# Patient Record
Sex: Female | Born: 1975 | Race: White | Hispanic: No | Marital: Married | Smoking: Never smoker
Health system: Southern US, Community
[De-identification: ages and names within clinical notes are randomized; demographics above are authoritative.]

## PROBLEM LIST (undated history)

## (undated) HISTORY — PX: CYST REMOVAL HAND: SHX6279

## (undated) HISTORY — PX: BREAST CYST EXCISION: SHX579

## (undated) HISTORY — PX: CYST REMOVAL NECK: SHX6281

---

## 2011-11-04 DIAGNOSIS — E785 Hyperlipidemia, unspecified: Secondary | ICD-10-CM | POA: Insufficient documentation

## 2011-11-04 DIAGNOSIS — E559 Vitamin D deficiency, unspecified: Secondary | ICD-10-CM | POA: Insufficient documentation

## 2012-05-03 DIAGNOSIS — Z6741 Type O blood, Rh negative: Secondary | ICD-10-CM | POA: Insufficient documentation

## 2020-08-05 ENCOUNTER — Encounter: Payer: Self-pay | Admitting: Medical-Surgical

## 2020-08-05 ENCOUNTER — Other Ambulatory Visit: Payer: Self-pay

## 2020-08-05 ENCOUNTER — Ambulatory Visit (INDEPENDENT_AMBULATORY_CARE_PROVIDER_SITE_OTHER): Payer: BC Managed Care – PPO | Admitting: Medical-Surgical

## 2020-08-05 VITALS — BP 117/75 | HR 66 | Temp 98.1°F | Ht 64.5 in | Wt 170.6 lb

## 2020-08-05 DIAGNOSIS — Z7689 Persons encountering health services in other specified circumstances: Secondary | ICD-10-CM | POA: Diagnosis not present

## 2020-08-05 DIAGNOSIS — Z1159 Encounter for screening for other viral diseases: Secondary | ICD-10-CM

## 2020-08-05 DIAGNOSIS — Z114 Encounter for screening for human immunodeficiency virus [HIV]: Secondary | ICD-10-CM

## 2020-08-05 DIAGNOSIS — Z0000 Encounter for general adult medical examination without abnormal findings: Secondary | ICD-10-CM | POA: Diagnosis not present

## 2020-08-05 DIAGNOSIS — M67431 Ganglion, right wrist: Secondary | ICD-10-CM

## 2020-08-05 NOTE — Progress Notes (Signed)
a  New Patient Office Visit  Subjective:  Patient ID: Mandy Suarez, female    DOB: Oct 07, 1976  Age: 44 y.o. MRN: [id number]  CC:  Chief Complaint  Patient presents with  . Establish Care    HPI Mandy Suarez presents to establish care.  Annual physical exam  Dentist- recently reestablished with a dentist, has a hole in one tooth that is needing repair. Eye exam- does not have an eye doctor and has never had a full eye exam, no vision changes or concerns Exercise- none currently Diet- no special diets  Ganglion cyst- dorsal right wrist ganglion cyst previously surgically removed but grew back soon after. She has to wear gloves at work and often bumps it causing it to be irritated. Not normally painful.  History reviewed. No pertinent past medical history.  Past Surgical History:  Procedure Laterality Date  . BREAST CYST EXCISION    . CYST REMOVAL HAND    . CYST REMOVAL NECK      Family History  Problem Relation Age of Onset  . Diabetes Father   . Breast cancer Maternal Grandmother   . Ovarian cancer Paternal Aunt     Social History   Socioeconomic History  . Marital status: Not on file    Spouse name: Not on file  . Number of children: Not on file  . Years of education: Not on file  . Highest education level: Not on file  Occupational History  . Not on file  Tobacco Use  . Smoking status: Never Smoker  . Smokeless tobacco: Never Used  Vaping Use  . Vaping Use: Never used  Substance and Sexual Activity  . Alcohol use: Not Currently  . Drug use: Never  . Sexual activity: Not Currently  Other Topics Concern  . Not on file  Social History Narrative  . Not on file   Social Determinants of Health   Financial Resource Strain:   . Difficulty of Paying Living Expenses: Not on file  Food Insecurity:   . Worried About Programme researcher, broadcasting/film/video in the Last Year: Not on file  . Ran Out of Food in the Last Year: Not on file  Transportation Needs:   . Lack of  Transportation (Medical): Not on file  . Lack of Transportation (Non-Medical): Not on file  Physical Activity:   . Days of Exercise per Week: Not on file  . Minutes of Exercise per Session: Not on file  Stress:   . Feeling of Stress : Not on file  Social Connections:   . Frequency of Communication with Friends and Family: Not on file  . Frequency of Social Gatherings with Friends and Family: Not on file  . Attends Religious Services: Not on file  . Active Member of Clubs or Organizations: Not on file  . Attends Banker Meetings: Not on file  . Marital Status: Not on file  Intimate Partner Violence:   . Fear of Current or Ex-Partner: Not on file  . Emotionally Abused: Not on file  . Physically Abused: Not on file  . Sexually Abused: Not on file    ROS Review of Systems  Constitutional: Negative for chills, fatigue, fever and unexpected weight change.  HENT: Negative for congestion, ear pain, sinus pressure and sore throat.   Eyes: Negative for visual disturbance.  Respiratory: Negative for cough, chest tightness, shortness of breath and wheezing.   Cardiovascular: Negative for chest pain, palpitations and leg swelling.  Gastrointestinal: Negative for abdominal  pain, constipation, diarrhea, nausea and vomiting.  Endocrine: Negative for cold intolerance, heat intolerance, polydipsia, polyphagia and polyuria.  Genitourinary: Negative for dysuria, frequency and urgency.  Musculoskeletal: Negative for arthralgias and myalgias.  Neurological: Negative for dizziness, weakness, light-headedness and headaches.  Hematological: Negative for adenopathy. Does not bruise/bleed easily.  Psychiatric/Behavioral: Negative for dysphoric mood, self-injury, sleep disturbance and suicidal ideas. The patient is not nervous/anxious.     Objective:   Today's Vitals: BP 117/75   Pulse 66   Temp 98.1 F (36.7 C) (Oral)   Ht 5' 4.5" (1.638 m)   Wt 170 lb 9.6 oz (77.4 kg)   LMP 08/03/2020    SpO2 96%   BMI 28.83 kg/m   Physical Exam Constitutional:      General: She is not in acute distress.    Appearance: Normal appearance. She is not ill-appearing.  HENT:     Head: Normocephalic and atraumatic.     Ears:     Comments: Poor visualization of bilateral TMs due to large amount of cerumen present in both ear canals.    Nose: Nose normal.     Mouth/Throat:     Mouth: Mucous membranes are moist.     Pharynx: No oropharyngeal exudate or posterior oropharyngeal erythema.  Eyes:     Extraocular Movements: Extraocular movements intact.     Conjunctiva/sclera: Conjunctivae normal.     Pupils: Pupils are equal, round, and reactive to light.  Neck:     Thyroid: No thyromegaly.     Vascular: No carotid bruit or JVD.     Trachea: Trachea normal.  Cardiovascular:     Rate and Rhythm: Normal rate and regular rhythm.     Pulses: Normal pulses.     Heart sounds: Normal heart sounds. No murmur heard.  No friction rub. No gallop.   Pulmonary:     Effort: Pulmonary effort is normal. No respiratory distress.     Breath sounds: Normal breath sounds. No wheezing.  Abdominal:     General: Bowel sounds are normal. There is no distension.     Palpations: Abdomen is soft.     Tenderness: There is no abdominal tenderness. There is no guarding.  Musculoskeletal:        General: Normal range of motion.     Cervical back: Normal range of motion and neck supple.  Skin:    General: Skin is warm and dry.  Neurological:     Mental Status: She is alert and oriented to person, place, and time.     Cranial Nerves: No cranial nerve deficit.  Psychiatric:        Mood and Affect: Mood normal.        Behavior: Behavior normal.        Thought Content: Thought content normal.        Judgment: Judgment normal.     Assessment & Plan:   1. Encounter to establish care Reviewed available information and discussed health concerns with patient. Requesting records from the [facility] in [facility] for last pap smear and mammogram.  2. Annual physical exam Checking CBC, CMP, and Lipid panel. - CBC - COMPLETE METABOLIC PANEL WITH GFR - Lipid panel  3. Encounter for screening for HIV Discussed screening recommendations. Patient agreeable so we will add this to blood work today. - HIV Antibody (routine testing w rflx)  4. Encounter for hepatitis C screening test for low risk patient Discussed screening recommendations. Patient agreeable so we will add this to blood  work today. - Hepatitis C antibody  5. Ganglion cyst of dorsum of right wrist Discussed ganglion cyst treatment and likelihood of recurrence. With the prominent placement and the regular irritation she is dealing with at work, advised that she should follow up with Dr. Karie Schwalbe for possible draining of the cyst.  No outpatient encounter medications on file as of 08/05/2020.   No facility-administered encounter medications on file as of 08/05/2020.   Follow-up: Return for annual physical exam in 1 year; ganglion cyst drainage with Dr. Karie Schwalbe at your convenience.   Thayer Ohm, DNP, APRN, FNP-BC [facility] and [facility] Medicine

## 2020-08-05 NOTE — Patient Instructions (Signed)

## 2020-08-06 LAB — HEPATITIS C ANTIBODY
Hepatitis C Ab: NONREACTIVE
SIGNAL TO CUT-OFF: 0.01 (ref <1.00)

## 2020-08-06 LAB — LIPID PANEL
Cholesterol: 254 mg/dL — ABNORMAL HIGH (ref <200)
HDL: 50 mg/dL (ref >=50)
LDL Cholesterol (Calc): 182 mg/dL (calc) — ABNORMAL HIGH
Non-HDL Cholesterol (Calc): 204 mg/dL (calc) — ABNORMAL HIGH (ref <130)
Total CHOL/HDL Ratio: 5.1 (calc) — ABNORMAL HIGH (ref <5.0)
Triglycerides: 101 mg/dL (ref <150)

## 2020-08-06 LAB — COMPLETE METABOLIC PANEL WITH GFR
AG Ratio: 2 (calc) (ref 1.0–2.5)
ALT: 24 U/L (ref 6–29)
AST: 22 U/L (ref 10–30)
Albumin: 4.3 g/dL (ref 3.6–5.1)
Alkaline phosphatase (APISO): 72 U/L (ref 31–125)
BUN: 11 mg/dL (ref 7–25)
CO2: 27 mmol/L (ref 20–32)
Calcium: 8.8 mg/dL (ref 8.6–10.2)
Chloride: 107 mmol/L (ref 98–110)
Creat: 0.59 mg/dL (ref 0.50–1.10)
GFR, Est African American: 131 mL/min/{1.73_m2} (ref >=60)
GFR, Est Non African American: 113 mL/min/{1.73_m2} (ref >=60)
Globulin: 2.2 g/dL (calc) (ref 1.9–3.7)
Glucose, Bld: 95 mg/dL (ref 65–99)
Potassium: 4 mmol/L (ref 3.5–5.3)
Sodium: 142 mmol/L (ref 135–146)
Total Bilirubin: 0.4 mg/dL (ref 0.2–1.2)
Total Protein: 6.5 g/dL (ref 6.1–8.1)

## 2020-08-06 LAB — HIV ANTIBODY (ROUTINE TESTING W REFLEX): HIV 1&2 Ab, 4th Generation: NONREACTIVE

## 2020-08-06 LAB — CBC
HCT: 39.8 % (ref 35.0–45.0)
Hemoglobin: 13.3 g/dL (ref 11.7–15.5)
MCH: 30.4 pg (ref 27.0–33.0)
MCHC: 33.4 g/dL (ref 32.0–36.0)
MCV: 91.1 fL (ref 80.0–100.0)
MPV: 9.4 fL (ref 7.5–12.5)
Platelets: 216 10*3/uL (ref 140–400)
RBC: 4.37 10*6/uL (ref 3.80–5.10)
RDW: 12.4 % (ref 11.0–15.0)
WBC: 6.1 10*3/uL (ref 3.8–10.8)

## 2020-08-20 ENCOUNTER — Ambulatory Visit (INDEPENDENT_AMBULATORY_CARE_PROVIDER_SITE_OTHER): Payer: BC Managed Care – PPO

## 2020-08-20 ENCOUNTER — Ambulatory Visit (INDEPENDENT_AMBULATORY_CARE_PROVIDER_SITE_OTHER): Payer: BC Managed Care – PPO | Admitting: Sports Medicine

## 2020-08-20 ENCOUNTER — Other Ambulatory Visit: Payer: Self-pay

## 2020-08-20 DIAGNOSIS — M67431 Ganglion, right wrist: Secondary | ICD-10-CM

## 2020-08-20 NOTE — Progress Notes (Signed)
    Procedures performed today:    Procedure: Real-time Ultrasound Guided aspiration/injection of right dorsal wrist ganglion Device: Samsung HS60  Verbal informed consent obtained.  Time-out conducted.  Noted no overlying erythema, induration, or other signs of local infection.  Skin prepped in a sterile fashion.  Local anesthesia: Topical Ethyl chloride.  With sterile technique and under real time ultrasound guidance:  Using 18-gauge needle I aspirated the dorsal ganglion, I then injected 1/2 cc kenalog 40.   Completed without difficulty  Pain immediately resolved suggesting accurate placement of the medication.  Advised to call if fevers/chills, erythema, induration, drainage, or persistent bleeding.  Images permanently stored and available for review in PACS.  Impression: Technically successful ultrasound guided injection.  ___________________________________________ Ihor Austin. Benjamin Stain, M.D., ABFM., CAQSM. Primary Care and Sports Medicine [facility]   Adjunct Instructor of Family Medicine  [employer]  Independent interpretation of notes and tests performed by another provider:   None.  Brief History, Exam, Impression, and Recommendations:    Ganglion cyst of wrist, right This is a very pleasant 44 year old female, she has a history of a ganglion cyst [place]urgical [place] out of [place]. She has never had an aspiration and injection. She is having recurrence of the cyst, it is large, unsightly and minimally tender. Aspiration and injection today, she is aware of the 50% recurrence rate. Return to see me in 1 month. 2 days of light duty at work.     ___________________________________________ Ihor Austin. Benjamin Stain, M.D., ABFM., CAQSM. Primary Care and Sports Medicine  [facility]  Adjunct Instructor of Family Medicine  [employer]

## 2020-08-20 NOTE — Assessment & Plan Note (Signed)
This is a very pleasant 44 year old female, she has a history of a ganglion cyst [place]. She has never had an aspiration and injection. She is having recurrence of the cyst, it is large, unsightly and minimally tender. Aspiration and injection today, she is aware of the 50% recurrence rate. Return to see me in 1 month. 2 days of light duty at work.

## 2020-09-17 ENCOUNTER — Ambulatory Visit (INDEPENDENT_AMBULATORY_CARE_PROVIDER_SITE_OTHER): Payer: BC Managed Care – PPO | Admitting: Sports Medicine

## 2020-09-17 DIAGNOSIS — M67431 Ganglion, right wrist: Secondary | ICD-10-CM | POA: Diagnosis not present

## 2020-09-17 NOTE — Assessment & Plan Note (Signed)
This is a pleasant 44 year old female, she has a history of a ganglion cyst [place] in [place]. She never had had an aspiration or injection. Today we aspirated the large ganglion cyst, she returns today, the cyst is approximately 1/2-1/3 of the size, nontender, at this point she feels as though she can live with it. She can return to see me on an as-needed basis, because is not painful we are going to leave it alone, but if she again desires any intervention I am happy to do a repeat aspiration and injection.

## 2020-09-17 NOTE — Progress Notes (Signed)
    Procedures performed today:    None.  Independent interpretation of notes and tests performed by another provider:   None.  Brief History, Exam, Impression, and Recommendations:    Ganglion cyst of wrist, right This is a pleasant 44 year old female, she has a history of a [place]. She never had had an aspiration or injection. Today we aspirated the large ganglion cyst, she returns today, the cyst is approximately 1/2-1/3 of the size, nontender, at this point she feels as though she can live with it. She can return to see me on an as-needed basis, because is not painful we are going to leave it alone, but if she again desires any intervention I am happy to do a repeat aspiration and injection.    ___________________________________________ Ihor Austin. Benjamin Stain, M.D., ABFM., CAQSM. Primary Care and Sports Medicine [facility]  Adjunct Instructor of Family Medicine  [employer]

## 2022-02-18 ENCOUNTER — Telehealth: Payer: BC Managed Care – PPO | Admitting: Nurse Practitioner

## 2022-02-18 DIAGNOSIS — Z3200 Encounter for pregnancy test, result unknown: Secondary | ICD-10-CM

## 2022-02-18 DIAGNOSIS — R102 Pelvic and perineal pain: Secondary | ICD-10-CM

## 2022-02-18 NOTE — Progress Notes (Signed)
Based on what you shared with me it looks like you have vaginal pain without discharge and may be pregnant,that should be evaluated in a face to face office visit. You need a urine pregnancy test as well as someone to evaluate yur vaginal pain for proper treatment. ? ?NOTE: There will be NO CHARGE for this eVisit ?  ?If you are having a true medical emergency please call 911.   ?  ? For an urgent face to face visit, [facility] has six urgent care centers for your convenience:  ?  ? [facility] ?Get Driving Directions ?[phone] ?[phone] ?[place], [place] [address] ?  ? [facility]) ?Get Driving Directions ?[phone] ?[address] ?[place], [place] [address] ? ?[facility]) ?Get Driving Directions ?[phone] ?[address] ?[place],  [place]  [address] ? ?[facility] ?Get Driving Directions ?[phone] ?[address] ?[place], [place] [address] ?  ?[facility] [place] ?Get Driving Directions  ?[phone] ?[address] ?[address] ?[place], [place] [address] ?  ?[facility] ?Get Driving Directions ?[phone] ?[address] ?[place], [place] [address] ? ?Your MyChart E-visit questionnaire answers were reviewed by a board certified advanced clinical practitioner to complete your personal care plan based on your specific symptoms.  Thank you for using e-Visits. ?  ? ?

## 2022-02-19 ENCOUNTER — Encounter: Payer: Self-pay | Admitting: Medical-Surgical

## 2022-02-19 ENCOUNTER — Telehealth: Payer: Self-pay | Admitting: *Deleted

## 2022-02-19 NOTE — Telephone Encounter (Signed)
Patient called wanting to be seen urgently.  ?No menstrual cycle since December. No abdominal pain, some minor cramping. No other symptoms. Scheduled for next available with Joy and if any acute symptoms develop she was instructed to call back.  ?

## 2022-02-19 NOTE — Telephone Encounter (Signed)
Returned call from 3:41 PM. Left patient a message to call and schedule New GYN appointment. ?

## 2022-02-24 ENCOUNTER — Ambulatory Visit: Payer: BC Managed Care – PPO | Admitting: Advanced Practice Midwife

## 2022-02-24 ENCOUNTER — Other Ambulatory Visit: Payer: Self-pay

## 2022-02-24 ENCOUNTER — Encounter: Payer: Self-pay | Admitting: Advanced Practice Midwife

## 2022-02-24 ENCOUNTER — Other Ambulatory Visit (HOSPITAL_COMMUNITY)
Admission: RE | Admit: 2022-02-24 | Discharge: 2022-02-24 | Disposition: A | Discharge status: Home / Self Care | Payer: BC Managed Care – PPO | Source: Ambulatory Visit | Attending: Advanced Practice Midwife | Admitting: Advanced Practice Midwife

## 2022-02-24 VITALS — BP 141/89 | HR 83 | Ht 64.5 in | Wt 169.0 lb

## 2022-02-24 DIAGNOSIS — Z01419 Encounter for gynecological examination (general) (routine) without abnormal findings: Secondary | ICD-10-CM | POA: Diagnosis not present

## 2022-02-24 DIAGNOSIS — N911 Secondary amenorrhea: Secondary | ICD-10-CM

## 2022-02-24 DIAGNOSIS — R102 Pelvic and perineal pain: Secondary | ICD-10-CM | POA: Diagnosis not present

## 2022-02-24 DIAGNOSIS — N6312 Unspecified lump in the right breast, upper inner quadrant: Secondary | ICD-10-CM

## 2022-02-24 DIAGNOSIS — Z1231 Encounter for screening mammogram for malignant neoplasm of breast: Secondary | ICD-10-CM | POA: Diagnosis not present

## 2022-02-24 NOTE — Progress Notes (Addendum)
? ?  GYNECOLOGY PROGRESS NOTE ? ?History:  ?45 y.o. No obstetric history on file. presents to [place] office today for problem gyn visit. She reports no menses x 3 months.  Prior to this periods were regular, monthly, and moderate in amount.  She reports some cramping like menstrual pain in the last 3 months but no bleeding.  She reports some occasional episodes of feeling hot but mostly these occur at work when she lifts heavy boxes, but have also occurred at home.  There are no other symptoms. Her mother had a tumor around the time she was expecting menopause and had a hysterectomy so she is concerned about that.  Her last Pap was normal, 3 years ago, but she is interested in repeating the Pap if we do an exam today.    She denies h/a, dizziness, shortness of breath, n/v, or fever/chills.   ? ?The following portions of the patient's history were reviewed and updated as appropriate: allergies, current medications, past family history, past medical history, past social history, past surgical history and problem list. Last pap smear on 07/27/2018 was normal, neg HRHPV. ? ?Health Maintenance Due  ?Topic Date Due  ? PAP SMEAR-Modifier  Never done  ? COVID-19 Vaccine (3 - Booster for Pfizer series) 06/19/2020  ? INFLUENZA VACCINE  Never done  ? TETANUS/TDAP  01/14/2022  ?  ? ?Review of Systems:  ?Pertinent items are noted in HPI. ?  ?Objective:  ?Physical Exam ?Blood pressure (!) 141/89, pulse 83, height 5' 4.5" (1.638 m), weight 169 lb (76.7 kg), last menstrual period 11/12/2021. ?VS reviewed, nursing note reviewed,  ?Constitutional: well developed, well nourished, no distress ?HEENT: normocephalic ?CV: normal rate ?Pulm/chest wall: normal effort ?Breast Exam:  right breast with one smooth round mobile mass, 3x3 cm in diameter at inner right part of breast, no skin or nipple changes or axillary nodes, left breast normal without mass, skin or nipple changes or axillary nodes ?Abdomen: soft ?Neuro: alert and  oriented x 3 ?Skin: warm, dry ?Psych: affect normal ?Pelvic exam: Cervix pink, visually closed, without lesion, scant white creamy discharge, vaginal walls and external genitalia normal ?Bimanual exam: Cervix 0/long/high, firm, anterior, neg CMT, uterus nontender, nonenlarged, adnexa without tenderness, enlargement, or mass ? ?Assessment & Plan:  ? ?1. Secondary physiologic amenorrhea ?--LMP 11/2021, regular monthly menses prior ?--Pelvic exam today wnl ?--Perimenopause vs other cause for oligomenorrhea/amenorrhea ? ?- Prolactin ?- TSH ?- B-HCG Quant ?- US PELVIC COMPLETE WITH TRANSVAGINAL; Future ? ?2. Acute pelvic pain, female ?--Cramping menstrual pain without bleeding x 2-3 months ?--LMP 11/2021 ? ?3. Well woman exam with routine gynecological exam ? ?- Cytology - PAP( [place])  ? ?Fatima Blank, CNM ?3:00 PM  ?

## 2022-02-25 NOTE — Addendum Note (Signed)
Addended by: Sharen Counter A on: 02/25/2022 08:29 PM ? ? Modules accepted: Orders ? ?

## 2022-02-26 LAB — CYTOLOGY - PAP
Adequacy: ABSENT
Comment: NEGATIVE
Diagnosis: NEGATIVE
High risk HPV: NEGATIVE

## 2022-02-27 ENCOUNTER — Other Ambulatory Visit: Payer: Self-pay

## 2022-02-27 ENCOUNTER — Ambulatory Visit (INDEPENDENT_AMBULATORY_CARE_PROVIDER_SITE_OTHER): Payer: BC Managed Care – PPO

## 2022-02-27 DIAGNOSIS — N911 Secondary amenorrhea: Secondary | ICD-10-CM | POA: Diagnosis not present

## 2022-02-27 DIAGNOSIS — N83201 Unspecified ovarian cyst, right side: Secondary | ICD-10-CM | POA: Diagnosis not present

## 2022-02-28 LAB — PROLACTIN: Prolactin: 6.3 ng/mL

## 2022-02-28 LAB — TSH: TSH: 2.08 mIU/L

## 2022-02-28 LAB — HCG, QUANTITATIVE, PREGNANCY: HCG, Total, QN: <3 m[IU]/mL

## 2022-03-02 ENCOUNTER — Telehealth: Payer: Self-pay | Admitting: Advanced Practice Midwife

## 2022-03-02 ENCOUNTER — Ambulatory Visit: Payer: BC Managed Care – PPO | Admitting: Medical-Surgical

## 2022-03-02 NOTE — Telephone Encounter (Signed)
Pt has normal pelvic ultrasound with follicular/simple cyst that will likely resolve.  She may have had an inovulatory cycle in January or February, causing her menses to be delayed. Based on [place] and lab findings, I would expect her periods to resume normally.  She should follow up in the office if her period does not return, or if she continues to have pelvic pain. ? ?I left a message for the patient to return my call to the office regarding her ultrasound results.   ?

## 2022-04-16 NOTE — Progress Notes (Signed)
HPI: ?Mandy Suarez is a 46 y.o. female who  has no past medical history on file. ? ?she presents to [facility] today, 04/17/22,  ?for chief complaint of: ?Annual physical exam ? ?Dentist: UTD, no concerns ?Eye exam: no correction, no concerns ?Exercise: walking a little ?Diet: no restrictions ?Pap smear: UTD, OBGYN ?Mammogram: UTD, OBGYN ?COVID vaccine: done with booster ?Concerns: ?None ? ?Past medical, surgical, social and family history reviewed: ? ?Patient Active Problem List  ? Diagnosis Date Noted  ? 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 Ganglion cyst of wrist, right 08/20/2020  ? Type O blood, Rh negative 05/03/2012  ? Vitamin D deficiency 11/04/2011  ? Other and unspecified hyperlipidemia 11/04/2011  ? ? ?Past Surgical History:  ?Procedure Laterality Date  ? BREAST CYST EXCISION    ? CYST REMOVAL HAND    ? CYST REMOVAL NECK    ? ? ?Social History  ? ?Tobacco Use  ? Smoking status: Never  ? Smokeless tobacco: Never  ?Substance Use Topics  ? Alcohol use: Not Currently  ? ? ?Family History  ?Problem Relation Age of Onset  ? Diabetes Father   ? Breast cancer Maternal Grandmother   ? Ovarian cancer Paternal Aunt   ?  ? ?Current medication list and allergy/intolerance information reviewed:   ? ?No current outpatient medications on file.  ? ?No current facility-administered medications for this visit.  ? ? ?No Known Allergies ?  ?Review of Systems: ?Constitutional:  No  fever, no chills, No recent illness, No unintentional weight changes. No significant fatigue.  ?HEENT: No  headache, no vision change, no hearing change, No sore throat, No  sinus pressure ?Cardiac: No  chest pain, No  pressure, No palpitations, No  Orthopnea ?Respiratory:  No  shortness of breath. No  Cough ?Gastrointestinal: No  abdominal pain, No  nausea, No  vomiting,  No  blood in stool, No  diarrhea, No  constipation  ?Musculoskeletal: No new myalgia/arthralgia ?Skin: No  Rash, No other  wounds/concerning lesions ?Genitourinary: No  incontinence, No  abnormal genital bleeding, No abnormal genital discharge ?Hem/Onc: No  easy bruising/bleeding, No  abnormal lymph node ?Endocrine: No cold intolerance,  No heat intolerance. No polyuria/polydipsia/polyphagia  ?Neurologic: No  weakness, No  dizziness, No  slurred speech/focal weakness/facial droop ?Psychiatric: No  concerns with depression, No  concerns with anxiety, No sleep problems, No mood problems ? ?Exam:  ?BP 127/83 (BP Location: Right Arm, Cuff Size: Normal)   Pulse 63   Resp 20   Ht 5' 4.5" (1.638 m)   Wt 171 lb 12.8 oz (77.9 kg)   SpO2 100%   BMI 29.03 kg/m?  ?Constitutional: VS see above. General Appearance: alert, well-developed, well-nourished, NAD ?Eyes: Normal lids and conjunctive, non-icteric sclera ?Ears, Nose, Mouth, Throat: MMM, Normal external inspection ears/nares/mouth/lips/gums. TM normal bilaterally. Pharynx/tonsils no erythema, no exudate. Nasal mucosa normal.  ?Neck: No masses, trachea midline. No thyroid enlargement. No tenderness/mass appreciated. No lymphadenopathy ?Respiratory: Normal respiratory effort. no wheeze, no rhonchi, no rales ?Cardiovascular: S1/S2 normal, no murmur, no rub/gallop auscultated. RRR. No lower extremity edema. Pedal pulse II/IV bilaterally DP and PT. No carotid bruit or JVD. No abdominal aortic bruit. ?Gastrointestinal: Nontender, no masses. No hepatomegaly, no splenomegaly. No hernia appreciated. Bowel sounds normal. Rectal exam deferred.  ?Musculoskeletal: Gait normal. No clubbing/cyanosis of digits.  ?Neurological: Normal balance/coordination. No tremor. No cranial nerve deficit on limited exam. Motor and sensation intact and symmetric. Cerebellar reflexes intact.  ?Skin: warm, dry,  intact. No rash/ulcer. No concerning nevi or subq nodules on limited exam.   ?Psychiatric: Normal judgment/insight. Normal mood and affect. Oriented x3.  ? ? ?ASSESSMENT/PLAN:  ? ?1. Annual physical exam ?Checking  labs today.  Up-to-date on preventative care.  Recommend updating eye exam at her earliest convenience.  Wellness information provided with AVS. ?- Lipid panel ?- COMPLETE METABOLIC PANEL WITH GFR ?- CBC with Differential/Platelet ? ?2. Need for tetanus booster ?Tdap given in office today.  VIS provided with AVS. ?- Tdap vaccine greater than or equal to 7yo IM ? ? ?Orders Placed This Encounter  ?Procedures  ? Tdap vaccine greater than or equal to 7yo IM  ? Lipid panel  ? COMPLETE METABOLIC PANEL WITH GFR  ? CBC with Differential/Platelet  ? ? ?No orders of the defined types were placed in this encounter. ? ? ?There are no Patient Instructions on file for this visit. ? ?Follow-up plan: Return in about 1 year (around 04/18/2023) for annual physical exam (or sooner if needed). ? ?Thayer Ohm, DNP, APRN, FNP-BC ?[facility] ?Primary Care and Sports Medicine ?

## 2022-04-17 ENCOUNTER — Encounter: Payer: Self-pay | Admitting: Medical-Surgical

## 2022-04-17 ENCOUNTER — Ambulatory Visit (INDEPENDENT_AMBULATORY_CARE_PROVIDER_SITE_OTHER): Payer: BC Managed Care – PPO | Admitting: Medical-Surgical

## 2022-04-17 VITALS — BP 127/83 | HR 63 | Resp 20 | Ht 64.5 in | Wt 171.8 lb

## 2022-04-17 DIAGNOSIS — Z23 Encounter for immunization: Secondary | ICD-10-CM | POA: Diagnosis not present

## 2022-04-17 DIAGNOSIS — Z0000 Encounter for general adult medical examination without abnormal findings: Secondary | ICD-10-CM | POA: Diagnosis not present

## 2022-04-18 LAB — CBC WITH DIFFERENTIAL/PLATELET
Absolute Monocytes: 530 cells/uL (ref 200–950)
Basophils Absolute: 40 cells/uL (ref 0–200)
Basophils Relative: 0.7 %
Eosinophils Absolute: 182 cells/uL (ref 15–500)
Eosinophils Relative: 3.2 %
HCT: 36.7 % (ref 35.0–45.0)
Hemoglobin: 12.1 g/dL (ref 11.7–15.5)
Lymphs Abs: 1927 cells/uL (ref 850–3900)
MCH: 30.3 pg (ref 27.0–33.0)
MCHC: 33 g/dL (ref 32.0–36.0)
MCV: 92 fL (ref 80.0–100.0)
MPV: 9.8 fL (ref 7.5–12.5)
Monocytes Relative: 9.3 %
Neutro Abs: 3021 cells/uL (ref 1500–7800)
Neutrophils Relative %: 53 %
Platelets: 220 10*3/uL (ref 140–400)
RBC: 3.99 10*6/uL (ref 3.80–5.10)
RDW: 12.1 % (ref 11.0–15.0)
Total Lymphocyte: 33.8 %
WBC: 5.7 10*3/uL (ref 3.8–10.8)

## 2022-04-18 LAB — COMPLETE METABOLIC PANEL WITH GFR
AG Ratio: 1.9 (calc) (ref 1.0–2.5)
ALT: 15 U/L (ref 6–29)
AST: 16 U/L (ref 10–30)
Albumin: 4.2 g/dL (ref 3.6–5.1)
Alkaline phosphatase (APISO): 73 U/L (ref 31–125)
BUN: 10 mg/dL (ref 7–25)
CO2: 29 mmol/L (ref 20–32)
Calcium: 8.7 mg/dL (ref 8.6–10.2)
Chloride: 106 mmol/L (ref 98–110)
Creat: 0.62 mg/dL (ref 0.50–0.99)
Globulin: 2.2 g/dL (calc) (ref 1.9–3.7)
Glucose, Bld: 81 mg/dL (ref 65–99)
Potassium: 3.7 mmol/L (ref 3.5–5.3)
Sodium: 142 mmol/L (ref 135–146)
Total Bilirubin: 0.4 mg/dL (ref 0.2–1.2)
Total Protein: 6.4 g/dL (ref 6.1–8.1)
eGFR: 113 mL/min/{1.73_m2} (ref >=60)

## 2022-04-18 LAB — LIPID PANEL
Cholesterol: 167 mg/dL (ref <200)
HDL: 42 mg/dL — ABNORMAL LOW (ref >=50)
LDL Cholesterol (Calc): 110 mg/dL (calc) — ABNORMAL HIGH
Non-HDL Cholesterol (Calc): 125 mg/dL (calc) (ref <130)
Total CHOL/HDL Ratio: 4 (calc) (ref <5.0)
Triglycerides: 61 mg/dL (ref <150)

## 2022-04-22 ENCOUNTER — Encounter: Payer: Self-pay | Admitting: Medical-Surgical

## 2022-04-22 ENCOUNTER — Ambulatory Visit (INDEPENDENT_AMBULATORY_CARE_PROVIDER_SITE_OTHER): Payer: BC Managed Care – PPO

## 2022-04-22 ENCOUNTER — Ambulatory Visit (INDEPENDENT_AMBULATORY_CARE_PROVIDER_SITE_OTHER): Payer: BC Managed Care – PPO | Admitting: Medical-Surgical

## 2022-04-22 DIAGNOSIS — M545 Low back pain, unspecified: Secondary | ICD-10-CM | POA: Diagnosis not present

## 2022-04-22 MED ORDER — METHYLPREDNISOLONE 4 MG PO TBPK: ORAL_TABLET | ORAL | 0 refills | Status: DC

## 2022-04-22 NOTE — Assessment & Plan Note (Signed)
Getting lumbar spine x-rays today.  No red flags so suspect this is simply soft tissue trauma from impact.  Discussed conservative treatment using ice, heat, and Tylenol 1000 mg every 8 hours as needed.  We will go ahead and treat with a methylprednisolone taper pack.  Handout provided with AVS for stretches to start at home to help with pain reduction and stiffness.  Once TaperPak has been completed, okay to use ibuprofen in conjunction with Tylenol. ?

## 2022-04-22 NOTE — Patient Instructions (Addendum)
Use Tylenol 1000 mg every 8 hours as needed ?Completed steroid taper as instructed ?Use heat or ice for comfort ?Once pain is better, start stretches below ? ?Low Back Sprain or Strain Rehab ?Ask your health care provider which exercises are safe for you. Do exercises exactly as told by your health care provider and adjust them as directed. It is normal to feel mild stretching, pulling, tightness, or discomfort as you do these exercises. Stop right away if you feel sudden pain or your pain gets worse. Do not begin these exercises until told by your health care provider. ?Stretching and range-of-motion exercises ?These exercises warm up your muscles and joints and improve the movement and flexibility of your back. These exercises also help to relieve pain, numbness, and tingling. ?Lumbar rotation ? ?Lie on your back on a firm bed or the floor with your knees bent. ?Straighten your arms out to your sides so each arm forms a 90-degree angle (right angle) with a side of your body. ?Slowly move (rotate) both of your knees to one side of your body until you feel a stretch in your lower back (lumbar). Try not to let your shoulders lift off the floor. ?Hold this position for __________ seconds. ?Tense your abdominal muscles and slowly move your knees back to the starting position. ?Repeat this exercise on the other side of your body. ?Repeat __________ times. Complete this exercise __________ times a day. ?Single knee to chest ? ?Lie on your back on a firm bed or the floor with both legs straight. ?Bend one of your knees. Use your hands to move your knee up toward your chest until you feel a gentle stretch in your lower back and buttock. ?Hold your leg in this position by holding on to the front of your knee. ?Keep your other leg as straight as possible. ?Hold this position for __________ seconds. ?Slowly return to the starting position. ?Repeat with your other leg. ?Repeat __________ times. Complete this exercise  __________ times a day. ?Prone extension on elbows ? ?Lie on your abdomen on a firm bed or the floor (prone position). ?Prop yourself up on your elbows. ?Use your arms to help lift your chest up until you feel a gentle stretch in your abdomen and your lower back. ?This will place some of your body weight on your elbows. If this is uncomfortable, try stacking pillows under your chest. ?Your hips should stay down, against the surface that you are lying on. Keep your hip and back muscles relaxed. ?Hold this position for __________ seconds. ?Slowly relax your upper body and return to the starting position. ?Repeat __________ times. Complete this exercise __________ times a day. ?Strengthening exercises ?These exercises build strength and endurance in your back. Endurance is the ability to use your muscles for a long time, even after they get tired. ?Pelvic tilt ?This exercise strengthens the muscles that lie deep in the abdomen. ?Lie on your back on a firm bed or the floor with your legs extended. ?Bend your knees so they are pointing toward the ceiling and your feet are flat on the floor. ?Tighten your lower abdominal muscles to press your lower back against the floor. This motion will tilt your pelvis so your tailbone points up toward the ceiling instead of pointing to your feet or the floor. ?To help with this exercise, you may place a small towel under your lower back and try to push your back into the towel. ?Hold this position for __________ seconds. ?Let your  muscles relax completely before you repeat this exercise. ?Repeat __________ times. Complete this exercise __________ times a day. ?Alternating arm and leg raises ? ?Get on your hands and knees on a firm surface. If you are on a hard floor, you may want to use padding, such as an exercise mat, to cushion your knees. ?Line up your arms and legs. Your hands should be directly below your shoulders, and your knees should be directly below your hips. ?Lift your  left leg behind you. At the same time, raise your right arm and straighten it in front of you. ?Do not lift your leg higher than your hip. ?Do not lift your arm higher than your shoulder. ?Keep your abdominal and back muscles tight. ?Keep your hips facing the ground. ?Do not arch your back. ?Keep your balance carefully, and do not hold your breath. ?Hold this position for __________ seconds. ?Slowly return to the starting position. ?Repeat with your right leg and your left arm. ?Repeat __________ times. Complete this exercise __________ times a day. ?Abdominal set with straight leg raise ? ?Lie on your back on a firm bed or the floor. ?Bend one of your knees and keep your other leg straight. ?Tense your abdominal muscles and lift your straight leg up, 4-6 inches (10-15 cm) off the ground. ?Keep your abdominal muscles tight and hold this position for __________ seconds. ?Do not hold your breath. ?Do not arch your back. Keep it flat against the ground. ?Keep your abdominal muscles tense as you slowly lower your leg back to the starting position. ?Repeat with your other leg. ?Repeat __________ times. Complete this exercise __________ times a day. ?Single leg lower with bent knees ?Lie on your back on a firm bed or the floor. ?Tense your abdominal muscles and lift your feet off the floor, one foot at a time, so your knees and hips are bent in 90-degree angles (right angles). ?Your knees should be over your hips and your lower legs should be parallel to the floor. ?Keeping your abdominal muscles tense and your knee bent, slowly lower one of your legs so your toe touches the ground. ?Lift your leg back up to return to the starting position. ?Do not hold your breath. ?Do not let your back arch. Keep your back flat against the ground. ?Repeat with your other leg. ?Repeat __________ times. Complete this exercise __________ times a day. ?Posture and body mechanics ?Good posture and healthy body mechanics can help to relieve  stress in your body's tissues and joints. Body mechanics refers to the movements and positions of your body while you do your daily activities. Posture is part of body mechanics. Good posture means: ?Your spine is in its natural S-curve position (neutral). ?Your shoulders are pulled back slightly. ?Your head is not tipped forward (neutral). ?Follow these guidelines to improve your posture and body mechanics in your everyday activities. ?Standing ? ?When standing, keep your spine neutral and your feet about hip-width apart. Keep a slight bend in your knees. Your ears, shoulders, and hips should line up. ?When you do a task in which you stand in one place for a long time, place one foot up on a stable object that is 2-4 inches (5-10 cm) high, such as a footstool. This helps keep your spine neutral. ?Sitting ? ?When sitting, keep your spine neutral and keep your feet flat on the floor. Use a footrest, if necessary, and keep your thighs parallel to the floor. Avoid rounding your shoulders, and avoid tilting  your head forward. ?When working at a desk or a computer, keep your desk at a height where your hands are slightly lower than your elbows. Slide your chair under your desk so you are close enough to maintain good posture. ?When working at a computer, place your monitor at a height where you are looking straight ahead and you do not have to tilt your head forward or downward to look at the screen. ?Resting ?When lying down and resting, avoid positions that are most painful for you. ?If you have pain with activities such as sitting, bending, stooping, or squatting, lie in a position in which your body does not bend very much. For example, avoid curling up on your side with your arms and knees near your chest (fetal position). ?If you have pain with activities such as standing for a long time or reaching with your arms, lie with your spine in a neutral position and bend your knees slightly. Try the following  positions: ?Lying on your side with a pillow between your knees. ?Lying on your back with a pillow under your knees. ?Lifting ? ?When lifting objects, keep your feet at least shoulder-width apart and tighten your abdominal
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
## 2022-04-24 ENCOUNTER — Telehealth: Payer: Self-pay | Admitting: Medical-Surgical
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
## 2022-04-28 NOTE — Telephone Encounter (Signed)
Contacted patient to let her know that we have completed the forms and faxed them to [phone]  Copy sent to scan

## 2022-06-04 ENCOUNTER — Ambulatory Visit: Payer: BC Managed Care – PPO | Admitting: Medical-Surgical

## 2022-08-06 ENCOUNTER — Ambulatory Visit (INDEPENDENT_AMBULATORY_CARE_PROVIDER_SITE_OTHER): Payer: BC Managed Care – PPO | Admitting: Family Medicine

## 2022-08-06 ENCOUNTER — Encounter: Payer: Self-pay | Admitting: Family Medicine

## 2022-08-06 VITALS — BP 145/80 | HR 57 | Ht 64.5 in | Wt 171.0 lb

## 2022-08-06 DIAGNOSIS — N644 Mastodynia: Secondary | ICD-10-CM

## 2022-08-06 DIAGNOSIS — N61 Mastitis without abscess: Secondary | ICD-10-CM | POA: Diagnosis not present

## 2022-08-06 DIAGNOSIS — Z23 Encounter for immunization: Secondary | ICD-10-CM | POA: Diagnosis not present

## 2022-08-06 MED ORDER — DOXYCYCLINE HYCLATE 100 MG PO TABS: 100.0000 mg | ORAL_TABLET | Freq: Two times a day (BID) | ORAL | 0 refills | Status: DC

## 2022-08-06 NOTE — Progress Notes (Signed)
Acute Office Visit  Subjective:     Patient ID: Mandy Suarez, female    DOB: Mar 12, 1976, 46 y.o.   MRN: [id number]  Chief Complaint  Patient presents with   Breast Mass    HPI Patient is in today for breast lump, right. Says had abnormal mammo in 2018.  It showed a possible mass/cyst in the right breast.  They had recommended short-term follow-up.  It was in the 10:30 position about 7 cm from the nipple.   Now feels something coming through the skin.  No drainage.   ROS      Objective:    BP (!) 145/80   Pulse (!) 57   Ht 5' 4.5" (1.638 m)   Wt 171 lb (77.6 kg)   SpO2 97%   BMI 28.90 kg/m    Physical Exam Vitals reviewed.  Constitutional:      Appearance: She is well-developed.  HENT:     Head: Normocephalic and atraumatic.  Eyes:     Conjunctiva/sclera: Conjunctivae normal.  Cardiovascular:     Rate and Rhythm: Normal rate.  Pulmonary:     Effort: Pulmonary effort is normal.  Skin:    General: Skin is dry.     Coloration: Skin is not pale.  Neurological:     Mental Status: She is alert and oriented to person, place, and time.  Psychiatric:        Behavior: Behavior normal.     3 and half by 5 cm erythematous very tender raised area near the right mid chest.  She has some erythema extending towards the mid breast about 3 to centimeters past the border of the raised area.  No results found for any visits on 08/06/22.    EXAM:  DIGITAL DIAGNOSTIC RIGHT MAMMOGRAM WITH CAD   ULTRASOUND RIGHT BREAST   COMPARISON:  Screening study, 07/27/2018.  No older exams.   ACR Breast Density Category c: The breast tissue is heterogeneously  dense, which may obscure small masses.   FINDINGS:  On spot compression imaging, the possible mass in the retroareolar  region mostly disperses suggesting that it was due to superimposed  tissue. The possible mass in the upper outer right breast more  completely disperses. There are no areas of architectural   distortion. No suspicious calcifications.   Mammographic images were processed with CAD.   On physical exam, no mass is palpated in the retroareolar or upper  outer right breast.   Targeted ultrasound is performed, showing a simple cyst in the right  breast at 10:30 o'clock, 7 cm the nipple, middle depth, measuring 6  x 5 x 5 mm. This likely accounts for the original screening  abnormality noted in the upper breast on the MLO view. There are no  retroareolar masses.   IMPRESSION:  1. The possible mass noted in the retroareolar right breast on the  screening study mostly disperses on spot compression imaging. There  is also no retroareolar sonographic mass. Given that the  mammographic finding does not fully resolve, short-term follow-up is  recommended.  2. Small benign cyst in the upper outer right breast at 10:30  o'clock.   RECOMMENDATION:  Diagnostic right breast mammography and possible ultrasound in 6  months.     Assessment & Plan:   Problem List Items Addressed This Visit   None Visit Diagnoses     Cellulitis of right breast    -  Primary   Relevant Orders   MM Digital Diagnostic Bilat  US BREAST COMPLETE UNI RIGHT INC AXILLA   Breast pain, right       Relevant Orders   MM Digital Diagnostic Bilat   US BREAST COMPLETE UNI RIGHT INC AXILLA      I strongly suspect an infected sebaceous cyst based on exam but it is difficult to say she definitely has some cellulitis presents organoid and placed on doxycycline.  Recommend warm compresses and will refer back for diagnostic mammogram and ultrasound for further work-up since it is on the breast and she has a prior history of a slight abnormality on her right breast noted back in 2019.  Meds ordered this encounter  Medications   doxycycline (VIBRA-TABS) 100 MG tablet    Sig: Take 1 tablet (100 mg total) by mouth 2 (two) times daily.    Dispense:  14 tablet    Refill:  0    No follow-ups on file.  Nani Gasser, MD

## 2022-08-06 NOTE — Telephone Encounter (Signed)
Hoping a couple of days of antibiotics will make a big difference so no additional restrictions at this time but if she still having a lot of pain past Sunday then let us know on Tuesday when we are back open.

## 2022-08-06 NOTE — Telephone Encounter (Signed)
Forwarding to Dr. Linford Arnold as she saw this patient today.

## 2022-08-11 ENCOUNTER — Encounter: Payer: Self-pay | Admitting: Family Medicine

## 2022-08-11 NOTE — Telephone Encounter (Signed)
FYI since you saw her for this issue

## 2022-08-12 ENCOUNTER — Encounter: Payer: Self-pay | Admitting: Family Medicine

## 2022-08-12 NOTE — Telephone Encounter (Signed)
Completed and given to [place].  Just need to clarify the return date.

## 2022-08-12 NOTE — Telephone Encounter (Signed)
I think the orders were faxed to [facility] in [place].  So I guess we can just refax them to [facility] in [place].  The [place] location may even be able to just transfer it to the breast center in [place].  Not sure.

## 2022-08-13 MED ORDER — DOXYCYCLINE HYCLATE 100 MG PO TABS: 100.0000 mg | ORAL_TABLET | Freq: Two times a day (BID) | ORAL | 0 refills | Status: DC

## 2022-08-18 ENCOUNTER — Encounter: Payer: Self-pay | Admitting: Family Medicine

## 2022-08-18 DIAGNOSIS — N61 Mastitis without abscess: Secondary | ICD-10-CM | POA: Diagnosis not present

## 2022-08-18 DIAGNOSIS — N644 Mastodynia: Secondary | ICD-10-CM | POA: Diagnosis not present

## 2022-08-18 DIAGNOSIS — N6310 Unspecified lump in the right breast, unspecified quadrant: Secondary | ICD-10-CM | POA: Diagnosis not present

## 2022-08-18 LAB — HM MAMMOGRAPHY

## 2022-08-19 ENCOUNTER — Telehealth: Payer: Self-pay | Admitting: *Deleted

## 2022-08-19 NOTE — Telephone Encounter (Signed)
Mandy Suarez will print and send to patient.

## 2022-08-19 NOTE — Telephone Encounter (Signed)
Form completed,faxed,confirmation received and scanned into patient's chart. 

## 2022-08-22 ENCOUNTER — Encounter: Payer: Self-pay | Admitting: Medical-Surgical

## 2022-08-24 ENCOUNTER — Telehealth: Payer: Self-pay | Admitting: *Deleted

## 2022-08-24 NOTE — Telephone Encounter (Signed)
Pt called stating that her return to work date is today.   I informed her that the date that Dr. Madilyn Fireman had written on her forms is correct and that she was told that if she was still experiencing any issues that she was to have f/u with her pcp. Pt thought that since she was given more ABX that it would count towards her absence. I told her that the ABX had nothing to do with her returning to work.   Pt stated that her job had her returning to work today. I informed her that Dr. Madilyn Fireman would not make any changes to her fmla.  She voiced understanding.

## 2022-09-15 ENCOUNTER — Ambulatory Visit (INDEPENDENT_AMBULATORY_CARE_PROVIDER_SITE_OTHER): Payer: BC Managed Care – PPO | Admitting: Medical-Surgical

## 2022-09-15 ENCOUNTER — Encounter: Payer: Self-pay | Admitting: Medical-Surgical

## 2022-09-15 VITALS — BP 112/68 | HR 75 | Resp 20 | Ht 64.5 in | Wt 174.9 lb

## 2022-09-15 DIAGNOSIS — M67431 Ganglion, right wrist: Secondary | ICD-10-CM

## 2022-09-15 DIAGNOSIS — N61 Mastitis without abscess: Secondary | ICD-10-CM

## 2022-09-15 NOTE — Progress Notes (Signed)
Established Patient Office Visit  Subjective   Patient ID: Mandy Suarez, female   DOB: [birth year] Age: 47 y.o. MRN: [id number]   Chief Complaint  Patient presents with   Cyst   HPI Pleasant 46 year old female presenting today for reevaluation of a cyst at that is draining.  She was seen on 8/31 by Dr. Linford Arnold and diagnosed with cellulitis of the breast.  She was provided with a 1 week course of doxycycline twice daily which she completed as prescribed.  Approximately 1 week later, she noted that the area in her breast had seem to come towards the surface and opened up with purulent drainage.  She underwent a bilateral diagnostic mammogram and ultrasound which showed a small residual cyst just under the skin that was draining pus.  Today, she reports that the cyst has improved greatly although it is still there.  It is now draining small amounts of clear fluid but has no other signs of current infection.  Notes that it seems to have gotten smaller and is no longer painful/tender.  No fevers, chills, or generalized myalgias.  Reports that the ganglion cyst of the dorsal right wrist has returned.  She has had this surgically removed in the past.  She has also recently had it drained and injected with steroids to help reduce it.  Unfortunately, it keeps coming back and is quite an annoyance with her job.  She does work with [employer] and has to use her hands throughout the day.  The ganglion cyst limits her mobility and poses a risk for accidental trauma to the area.  She is interested in having this removed again.   Objective:    Vitals:   09/15/22 1357  BP: 112/68  Pulse: 75  Resp: 20  Height: 5' 4.5" (1.638 m)  Weight: 174 lb 14.4 oz (79.3 kg)  SpO2: 98%  BMI (Calculated): 29.57    Physical Exam Vitals and nursing note reviewed.  Constitutional:      General: She is not in acute distress.    Appearance: Normal appearance. She is not ill-appearing.  HENT:     Head: Normocephalic and  atraumatic.  Cardiovascular:     Rate and Rhythm: Normal rate and regular rhythm.     Pulses: Normal pulses.     Heart sounds: Normal heart sounds.  Pulmonary:     Effort: Pulmonary effort is normal. No respiratory distress.     Breath sounds: Normal breath sounds. No wheezing, rhonchi or rales.  Chest:    Musculoskeletal:     Comments: Large dorsal right wrist ganglion cyst.  Skin:    General: Skin is warm and dry.  Neurological:     Mental Status: She is alert and oriented to person, place, and time.  Psychiatric:        Mood and Affect: Mood normal.        Behavior: Behavior normal.        Thought Content: Thought content normal.        Judgment: Judgment normal.      No results found for this or any previous visit (from the past 24 hour(s)).     The 10-year ASCVD risk score (Arnett DK, et al., 2019) is: 0.7%   Values used to calculate the score:     Age: 65 years     Sex: Female     Is Non-Hispanic African American: No     Diabetic: No     Tobacco smoker: No  Systolic Blood Pressure: 073 mmHg     Is BP treated: No     HDL Cholesterol: 42 mg/dL     Total Cholesterol: 167 mg/dL   Assessment & Plan:   1. Cellulitis of right breast Symptoms do appear to be improving slowly.  She has clear drainage and no indications for current infection.  Suspect that this will resolve over the next 2 to 3 weeks.  If it does not, we may need to get her in with general surgery for further evaluation and intervention.  Patient verbalizes understanding and will monitor for signs/symptoms of infection.  She will let us know if we need to place a referral for her in a couple of weeks.  2. Ganglion cyst of wrist, right Unfortunately, she has had this ganglion cyst removed before and it is very likely to return even with surgical removal.  Since she would like to have it addressed, referring to hand surgery. - Ambulatory referral to Hand Surgery  Return if symptoms worsen or fail to  improve.  ___________________________________________ Clearnce Sorrel, DNP, APRN, FNP-BC Primary Care and [place]

## 2022-10-01 DIAGNOSIS — M67431 Ganglion, right wrist: Secondary | ICD-10-CM | POA: Diagnosis not present

## 2022-10-01 DIAGNOSIS — M25531 Pain in right wrist: Secondary | ICD-10-CM | POA: Diagnosis not present

## 2022-10-09 DIAGNOSIS — M67431 Ganglion, right wrist: Secondary | ICD-10-CM | POA: Diagnosis not present

## 2022-10-22 DIAGNOSIS — M67431 Ganglion, right wrist: Secondary | ICD-10-CM | POA: Diagnosis not present

## 2023-04-19 ENCOUNTER — Ambulatory Visit (INDEPENDENT_AMBULATORY_CARE_PROVIDER_SITE_OTHER): Payer: BC Managed Care – PPO | Admitting: Medical-Surgical

## 2023-04-19 ENCOUNTER — Encounter: Payer: Self-pay | Admitting: Medical-Surgical

## 2023-04-19 VITALS — BP 125/77 | HR 67 | Resp 20 | Ht 64.5 in | Wt 180.6 lb

## 2023-04-19 DIAGNOSIS — Z13220 Encounter for screening for lipoid disorders: Secondary | ICD-10-CM

## 2023-04-19 DIAGNOSIS — Z0000 Encounter for general adult medical examination without abnormal findings: Secondary | ICD-10-CM | POA: Diagnosis not present

## 2023-04-19 DIAGNOSIS — Z1211 Encounter for screening for malignant neoplasm of colon: Secondary | ICD-10-CM

## 2023-04-19 NOTE — Progress Notes (Signed)
Complete physical exam  Patient: Mandy Suarez   DOB: 1976-06-21   47 y.o. Female  MRN: [id number]  Subjective:    Chief Complaint  Patient presents with   Annual Exam   ABIHA IM is a 47 y.o. female who presents today for a complete physical exam. She reports consuming a general diet.  Walking at the park.  She generally feels well. She reports sleeping fairly well. She does not have additional problems to discuss today.    Most recent fall risk assessment:    04/19/2023   10:56 AM  Fall Risk   Falls in the past year? 0  Number falls in past yr: 0  Injury with Fall? 0  Risk for fall due to : No Fall Risks  Follow up Falls evaluation completed     Most recent depression screenings:    04/19/2023   10:57 AM 09/15/2022    1:57 PM  PHQ 2/9 Scores  PHQ - 2 Score 0 0    Vision:Within last year, Dental: No current dental problems and Receives regular dental care, and STD: The patient denies history of sexually transmitted disease.    Patient Care Team: Christen Butter, NP as PCP - General (Nurse Practitioner)   No outpatient medications prior to visit.   No facility-administered medications prior to visit.   Review of Systems  Constitutional:  Negative for chills, fever, malaise/fatigue and weight loss.  HENT:  Negative for congestion, ear pain, hearing loss, sinus pain and sore throat.   Eyes:  Negative for blurred vision, photophobia and pain.  Respiratory:  Negative for cough, shortness of breath and wheezing.   Cardiovascular:  Negative for chest pain, palpitations and leg swelling.  Gastrointestinal:  Negative for abdominal pain, constipation, diarrhea, heartburn, nausea and vomiting.  Genitourinary:  Negative for dysuria, frequency and urgency.  Musculoskeletal:  Negative for falls and neck pain.  Skin:  Negative for itching and rash.  Neurological:  Negative for dizziness, weakness and headaches.  Endo/Heme/Allergies:  Negative for polydipsia. Does not  bruise/bleed easily.  Psychiatric/Behavioral:  Negative for depression, substance abuse and suicidal ideas. The patient is not nervous/anxious.      Objective:    BP 125/77 (BP Location: Left Arm, Cuff Size: Normal)   Pulse 67   Resp 20   Ht 5' 4.5" (1.638 m)   Wt 180 lb 9.6 oz (81.9 kg)   SpO2 99%   BMI 30.52 kg/m    Physical Exam Vitals reviewed.  Constitutional:      General: She is not in acute distress.    Appearance: Normal appearance. She is obese. She is not ill-appearing.  HENT:     Head: Normocephalic and atraumatic.     Right Ear: Tympanic membrane, ear canal and external ear normal. There is no impacted cerumen.     Left Ear: Tympanic membrane, ear canal and external ear normal. There is no impacted cerumen.     Nose: Nose normal. No congestion or rhinorrhea.     Mouth/Throat:     Mouth: Mucous membranes are moist.     Pharynx: No oropharyngeal exudate or posterior oropharyngeal erythema.  Eyes:     General: No scleral icterus.       Right eye: No discharge.        Left eye: No discharge.     Extraocular Movements: Extraocular movements intact.     Conjunctiva/sclera: Conjunctivae normal.     Pupils: Pupils are equal, round, and reactive to light.  Neck:     Thyroid: No thyromegaly.     Vascular: No carotid bruit or JVD.     Trachea: Trachea normal.  Cardiovascular:     Rate and Rhythm: Normal rate and regular rhythm.     Pulses: Normal pulses.     Heart sounds: Normal heart sounds. No murmur heard.    No friction rub. No gallop.  Pulmonary:     Effort: Pulmonary effort is normal. No respiratory distress.     Breath sounds: Normal breath sounds. No wheezing.  Abdominal:     General: Bowel sounds are normal. There is no distension.     Palpations: Abdomen is soft.     Tenderness: There is no abdominal tenderness. There is no guarding.  Musculoskeletal:        General: Normal range of motion.     Cervical back: Normal range of motion and neck supple.   Lymphadenopathy:     Cervical: No cervical adenopathy.  Skin:    General: Skin is warm and dry.  Neurological:     Mental Status: She is alert and oriented to person, place, and time.     Cranial Nerves: No cranial nerve deficit.  Psychiatric:        Mood and Affect: Mood normal.        Behavior: Behavior normal.        Thought Content: Thought content normal.        Judgment: Judgment normal.      No results found for any visits on 04/19/23.     Assessment & Plan:    Routine Health Maintenance and Physical Exam  Immunization History  Administered Date(s) Administered   Influenza,inj,Quad PF,6+ Mos 08/06/2022   PFIZER(Purple Top)SARS-COV-2 Vaccination 04/02/2020, 04/24/2020   Rho (D) Immune Globulin 05/03/2012   Tdap 01/15/2012, 04/17/2022    Health Maintenance  Topic Date Due   COLONOSCOPY (Pts 45-58yrs Insurance coverage will need to be confirmed)  Never done   COVID-19 Vaccine (3 - 2023-24 season) 05/05/2023 (Originally 08/07/2022)   INFLUENZA VACCINE  07/08/2023   PAP SMEAR-Modifier  02/25/2027   DTaP/Tdap/Td (3 - Td or Tdap) 04/17/2032   Hepatitis C Screening  Completed   HIV Screening  Completed   HPV VACCINES  Aged Out    Discussed health benefits of physical activity, and encouraged her to engage in regular exercise appropriate for her age and condition.  1. Colon cancer screening Referring to GI.  - Ambulatory referral to Gastroenterology  2. Annual physical exam Checking labs as below. UTD on preventative care. Wellness information provided with AVS.  - CBC with Differential/Platelet - COMPLETE METABOLIC PANEL WITH GFR - Lipid panel  3. Lipid screening Checking lipids today.  - Lipid panel  Return in about 1 year (around 04/18/2024) for annual physical exam.   Christen Butter, NP

## 2023-04-20 LAB — COMPLETE METABOLIC PANEL WITH GFR
AG Ratio: 2 (calc) (ref 1.0–2.5)
ALT: 22 U/L (ref 6–29)
AST: 20 U/L (ref 10–35)
Albumin: 4.3 g/dL (ref 3.6–5.1)
Alkaline phosphatase (APISO): 68 U/L (ref 31–125)
BUN: 9 mg/dL (ref 7–25)
CO2: 27 mmol/L (ref 20–32)
Calcium: 8.8 mg/dL (ref 8.6–10.2)
Chloride: 104 mmol/L (ref 98–110)
Creat: 0.67 mg/dL (ref 0.50–0.99)
Globulin: 2.2 g/dL (calc) (ref 1.9–3.7)
Glucose, Bld: 97 mg/dL (ref 65–99)
Potassium: 4 mmol/L (ref 3.5–5.3)
Sodium: 139 mmol/L (ref 135–146)
Total Bilirubin: 0.4 mg/dL (ref 0.2–1.2)
Total Protein: 6.5 g/dL (ref 6.1–8.1)
eGFR: 110 mL/min/{1.73_m2} (ref >=60)

## 2023-04-20 LAB — LIPID PANEL
Cholesterol: 216 mg/dL — ABNORMAL HIGH (ref <200)
HDL: 50 mg/dL (ref >=50)
LDL Cholesterol (Calc): 144 mg/dL (calc) — ABNORMAL HIGH
Non-HDL Cholesterol (Calc): 166 mg/dL (calc) — ABNORMAL HIGH (ref <130)
Total CHOL/HDL Ratio: 4.3 (calc) (ref <5.0)
Triglycerides: 102 mg/dL (ref <150)

## 2023-04-20 LAB — CBC WITH DIFFERENTIAL/PLATELET
Absolute Monocytes: 576 cells/uL (ref 200–950)
Basophils Absolute: 58 cells/uL (ref 0–200)
Basophils Relative: 0.8 %
Eosinophils Absolute: 230 cells/uL (ref 15–500)
Eosinophils Relative: 3.2 %
HCT: 39.4 % (ref 35.0–45.0)
Hemoglobin: 13.1 g/dL (ref 11.7–15.5)
Lymphs Abs: 2297 cells/uL (ref 850–3900)
MCH: 29.8 pg (ref 27.0–33.0)
MCHC: 33.2 g/dL (ref 32.0–36.0)
MCV: 89.7 fL (ref 80.0–100.0)
MPV: 9.7 fL (ref 7.5–12.5)
Monocytes Relative: 8 %
Neutro Abs: 4039 cells/uL (ref 1500–7800)
Neutrophils Relative %: 56.1 %
Platelets: 209 10*3/uL (ref 140–400)
RBC: 4.39 10*6/uL (ref 3.80–5.10)
RDW: 12.3 % (ref 11.0–15.0)
Total Lymphocyte: 31.9 %
WBC: 7.2 10*3/uL (ref 3.8–10.8)
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
## 2024-04-20 ENCOUNTER — Encounter: Payer: Self-pay | Admitting: Medical-Surgical

## 2024-04-20 ENCOUNTER — Ambulatory Visit (INDEPENDENT_AMBULATORY_CARE_PROVIDER_SITE_OTHER): Payer: BC Managed Care – PPO | Admitting: Medical-Surgical

## 2024-04-20 VITALS — BP 125/78 | HR 69 | Resp 20 | Ht 64.5 in | Wt 176.0 lb

## 2024-04-20 DIAGNOSIS — E559 Vitamin D deficiency, unspecified: Secondary | ICD-10-CM | POA: Diagnosis not present

## 2024-04-20 DIAGNOSIS — Z0000 Encounter for general adult medical examination without abnormal findings: Secondary | ICD-10-CM

## 2024-04-20 DIAGNOSIS — E785 Hyperlipidemia, unspecified: Secondary | ICD-10-CM

## 2024-04-20 DIAGNOSIS — Z1211 Encounter for screening for malignant neoplasm of colon: Secondary | ICD-10-CM | POA: Diagnosis not present

## 2024-04-20 DIAGNOSIS — Z1231 Encounter for screening mammogram for malignant neoplasm of breast: Secondary | ICD-10-CM

## 2024-04-20 DIAGNOSIS — R222 Localized swelling, mass and lump, trunk: Secondary | ICD-10-CM

## 2024-04-20 NOTE — Patient Instructions (Signed)
 Preventive Care 16-46 Years Old, Female  Preventive care refers to lifestyle choices and visits with your health care provider that can promote health and wellness. Preventive care visits are also called wellness exams.  What can I expect for my preventive care visit?  Counseling  Your health care provider may ask you questions about your:  Medical history, including:  Past medical problems.  Family medical history.  Pregnancy history.  Current health, including:  Menstrual cycle.  Method of birth control.  Emotional well-being.  Home life and relationship well-being.  Sexual activity and sexual health.  Lifestyle, including:  Alcohol, nicotine or tobacco, and drug use.  Access to firearms.  Diet, exercise, and sleep habits.  Work and work Astronomer.  Sunscreen use.  Safety issues such as seatbelt and bike helmet use.  Physical exam  Your health care provider will check your:  Height and weight. These may be used to calculate your BMI (body mass index). BMI is a measurement that tells if you are at a healthy weight.  Waist circumference. This measures the distance around your waistline. This measurement also tells if you are at a healthy weight and may help predict your risk of certain diseases, such as type 2 diabetes and high blood pressure.  Heart rate and blood pressure.  Body temperature.  Skin for abnormal spots.  What immunizations do I need?    Vaccines are usually given at various ages, according to a schedule. Your health care provider will recommend vaccines for you based on your age, medical history, and lifestyle or other factors, such as travel or where you work.  What tests do I need?  Screening  Your health care provider may recommend screening tests for certain conditions. This may include:  Lipid and cholesterol levels.  Diabetes screening. This is done by checking your blood sugar (glucose) after you have not eaten for a while (fasting).  Pelvic exam and Pap test.  Hepatitis B test.  Hepatitis C  test.  HIV (human immunodeficiency virus) test.  STI (sexually transmitted infection) testing, if you are at risk.  Lung cancer screening.  Colorectal cancer screening.  Mammogram. Talk with your health care provider about when you should start having regular mammograms. This may depend on whether you have a family history of breast cancer.  BRCA-related cancer screening. This may be done if you have a family history of breast, ovarian, tubal, or peritoneal cancers.  Bone density scan. This is done to screen for osteoporosis.  Talk with your health care provider about your test results, treatment options, and if necessary, the need for more tests.  Follow these instructions at home:  Eating and drinking    Eat a diet that includes fresh fruits and vegetables, whole grains, lean protein, and low-fat dairy products.  Take vitamin and mineral supplements as recommended by your health care provider.  Do not drink alcohol if:  Your health care provider tells you not to drink.  You are pregnant, may be pregnant, or are planning to become pregnant.  If you drink alcohol:  Limit how much you have to 0-1 drink a day.  Know how much alcohol is in your drink. In the [place] one drink equals one 12 oz bottle of beer (355 mL), one 5 oz glass of wine (148 mL), or one 1 oz glass of hard liquor (44 mL).  Lifestyle  Brush your teeth every morning and night with fluoride toothpaste. Floss one time each day.  Exercise for at least  30 minutes 5 or more days each week.  Do not use any products that contain nicotine or tobacco. These products include cigarettes, chewing tobacco, and vaping devices, such as e-cigarettes. If you need help quitting, ask your health care provider.  Do not use drugs.  If you are sexually active, practice safe sex. Use a condom or other form of protection to prevent STIs.  If you do not wish to become pregnant, use a form of birth control. If you plan to become pregnant, see your health care provider for a  prepregnancy visit.  Take aspirin only as told by your health care provider. Make sure that you understand how much to take and what form to take. Work with your health care provider to find out whether it is safe and beneficial for you to take aspirin daily.  Find healthy ways to manage stress, such as:  Meditation, yoga, or listening to music.  Journaling.  Talking to a trusted person.  Spending time with friends and family.  Minimize exposure to UV radiation to reduce your risk of skin cancer.  Safety  Always wear your seat belt while driving or riding in a vehicle.  Do not drive:  If you have been drinking alcohol. Do not ride with someone who has been drinking.  When you are tired or distracted.  While texting.  If you have been using any mind-altering substances or drugs.  Wear a helmet and other protective equipment during sports activities.  If you have firearms in your house, make sure you follow all gun safety procedures.  Seek help if you have been physically or sexually abused.  What's next?  Visit your health care provider once a year for an annual wellness visit.  Ask your health care provider how often you should have your eyes and teeth checked.  Stay up to date on all vaccines.  This information is not intended to replace advice given to you by your health care provider. Make sure you discuss any questions you have with your health care provider.  Document Revised: 05/21/2021 Document Reviewed: 05/21/2021  Elsevier Patient Education  2024 [employer].

## 2024-04-20 NOTE — Progress Notes (Signed)
 Complete physical exam  Patient: Mandy Suarez   DOB: May 11, 1976   48 y.o. Female  MRN: [id number]  Subjective:     Chief Complaint  Patient presents with   Annual Exam    Mandy Suarez is a 48 y.o. female who presents today for a complete physical exam. She reports consuming a general diet. Walking and playing with her kids for exercise.  She generally feels fairly well. She reports sleeping well. She does not have additional problems to discuss today.    Most recent fall risk assessment:    04/19/2023   10:56 AM  Fall Risk   Falls in the past year? 0  Number falls in past yr: 0  Injury with Fall? 0  Risk for fall due to : No Fall Risks  Follow up Falls evaluation completed     Most recent depression screenings:    04/20/2024    2:08 PM 04/19/2023   10:57 AM  PHQ 2/9 Scores  PHQ - 2 Score 0 0    Vision:Within last year, Dental: No current dental problems and Receives regular dental care, and STD: The patient denies history of sexually transmitted disease.    Patient Care Team: Cherre Cornish, NP as PCP - General (Nurse Practitioner)   No outpatient medications prior to visit.   No facility-administered medications prior to visit.   Review of Systems  Constitutional:  Negative for chills, fever, malaise/fatigue and weight loss.  HENT:  Negative for congestion, ear pain, hearing loss, sinus pain and sore throat.   Eyes:  Negative for blurred vision, photophobia and pain.  Respiratory:  Negative for cough, shortness of breath and wheezing.   Cardiovascular:  Negative for chest pain, palpitations and leg swelling.  Gastrointestinal:  Negative for abdominal pain, constipation, diarrhea, heartburn, nausea and vomiting.  Genitourinary:  Negative for dysuria, frequency and urgency.  Musculoskeletal:  Negative for falls and neck pain.  Skin:  Negative for itching and rash.  Neurological:  Negative for dizziness, weakness and headaches.  Endo/Heme/Allergies:  Negative  for polydipsia. Does not bruise/bleed easily.  Psychiatric/Behavioral:  Negative for depression, substance abuse and suicidal ideas. The patient is not nervous/anxious.      Objective:     BP 125/78 (BP Location: Left Arm, Cuff Size: Normal)   Pulse 69   Resp 20   Ht 5' 4.5" (1.638 m)   Wt 176 lb 0.6 oz (79.9 kg)   SpO2 97%   BMI 29.75 kg/m    Physical Exam Vitals reviewed.  Constitutional:      General: She is not in acute distress.    Appearance: Normal appearance. She is not ill-appearing.  HENT:     Head: Normocephalic and atraumatic.     Right Ear: Tympanic membrane, ear canal and external ear normal. There is no impacted cerumen.     Left Ear: Tympanic membrane, ear canal and external ear normal. There is no impacted cerumen.     Nose: Nose normal. No congestion or rhinorrhea.     Mouth/Throat:     Mouth: Mucous membranes are moist.     Pharynx: No oropharyngeal exudate or posterior oropharyngeal erythema.  Eyes:     General: No scleral icterus.       Right eye: No discharge.        Left eye: No discharge.     Extraocular Movements: Extraocular movements intact.     Conjunctiva/sclera: Conjunctivae normal.     Pupils: Pupils are equal, round, and reactive  to light.  Neck:     Thyroid : No thyromegaly.     Vascular: No carotid bruit or JVD.     Trachea: Trachea normal.  Cardiovascular:     Rate and Rhythm: Normal rate and regular rhythm.     Pulses: Normal pulses.     Heart sounds: Normal heart sounds. No murmur heard.    No friction rub. No gallop.  Pulmonary:     Effort: Pulmonary effort is normal. No respiratory distress.     Breath sounds: Normal breath sounds. No wheezing.  Chest:    Abdominal:     General: Bowel sounds are normal. There is no distension.     Palpations: Abdomen is soft.     Tenderness: There is no abdominal tenderness. There is no guarding.  Musculoskeletal:        General: Normal range of motion.     Cervical back: Normal range of  motion and neck supple.  Lymphadenopathy:     Cervical: No cervical adenopathy.  Skin:    General: Skin is warm and dry.  Neurological:     Mental Status: She is alert and oriented to person, place, and time.     Cranial Nerves: No cranial nerve deficit.  Psychiatric:        Mood and Affect: Mood normal.        Behavior: Behavior normal.        Thought Content: Thought content normal.        Judgment: Judgment normal.   No results found for any visits on 04/20/24.     Assessment & Plan:    Routine Health Maintenance and Physical Exam  Immunization History  Administered Date(s) Administered   Influenza,inj,Quad PF,6+ Mos 08/06/2022   PFIZER(Purple Top)SARS-COV-2 Vaccination 04/02/2020, 04/24/2020   Rho (D) Immune Globulin 05/03/2012   Tdap 01/15/2012, 04/17/2022    Health Maintenance  Topic Date Due   Colonoscopy  Never done   COVID-19 Vaccine (3 - 2024-25 season) 05/06/2024 (Originally 08/08/2023)   INFLUENZA VACCINE  07/07/2024   Cervical Cancer Screening (HPV/Pap Cotest)  02/25/2027   DTaP/Tdap/Td (3 - Td or Tdap) 04/17/2032   Hepatitis C Screening  Completed   HIV Screening  Completed   HPV VACCINES  Aged Out   Meningococcal B Vaccine  Aged Out    Discussed health benefits of physical activity, and encouraged her to engage in regular exercise appropriate for her age and condition.  1. Annual physical exam (Primary) Checking labs as below. UTD on preventative care. Wellness information provided with AVS. - CBC with Differential/Platelet - CMP14+EGFR  2. Vitamin D deficiency Checking vitamin D. - VITAMIN D 25 Hydroxy (Vit-D Deficiency, Fractures)  3. Colon cancer screening Referring to GI for colonoscopy. - Ambulatory referral to Gastroenterology  4. Dyslipidemia Checking lipid panel. - Lipid panel  5. Encounter for screening mammogram for malignant neoplasm of breast Mammogram ordered to be completed externally. - MM 3D DIAGNOSTIC MAMMOGRAM BILATERAL  BREAST; Future  6. Lump in chest Getting ultrasound of the soft tissue of the chest for further characterization. - US  CHEST SOFT TISSUE; Future  Return in about 1 year (around 04/20/2025) for annual physical exam or sooner if needed.   Jerlyn Pain, NP

## 2024-04-21 LAB — CBC WITH DIFFERENTIAL/PLATELET
Basophils Absolute: 0.1 10*3/uL (ref 0.0–0.2)
Basos: 1 %
EOS (ABSOLUTE): 0.2 10*3/uL (ref 0.0–0.4)
Eos: 3 %
Hematocrit: 41.4 % (ref 34.0–46.6)
Hemoglobin: 13.9 g/dL (ref 11.1–15.9)
Immature Grans (Abs): 0 10*3/uL (ref 0.0–0.1)
Immature Granulocytes: 0 %
Lymphocytes Absolute: 2.3 10*3/uL (ref 0.7–3.1)
Lymphs: 40 %
MCH: 30.9 pg (ref 26.6–33.0)
MCHC: 33.6 g/dL (ref 31.5–35.7)
MCV: 92 fL (ref 79–97)
Monocytes Absolute: 0.4 10*3/uL (ref 0.1–0.9)
Monocytes: 7 %
Neutrophils Absolute: 2.8 10*3/uL (ref 1.4–7.0)
Neutrophils: 49 %
Platelets: 246 10*3/uL (ref 150–450)
RBC: 4.5 x10E6/uL (ref 3.77–5.28)
RDW: 12.5 % (ref 11.7–15.4)
WBC: 5.7 10*3/uL (ref 3.4–10.8)

## 2024-04-21 LAB — CMP14+EGFR
ALT: 24 IU/L (ref 0–32)
AST: 28 IU/L (ref 0–40)
Albumin: 4.3 g/dL (ref 3.9–4.9)
Alkaline Phosphatase: 100 IU/L (ref 44–121)
BUN/Creatinine Ratio: 13 (ref 9–23)
BUN: 10 mg/dL (ref 6–24)
Bilirubin Total: 0.4 mg/dL (ref 0.0–1.2)
CO2: 24 mmol/L (ref 20–29)
Calcium: 9.1 mg/dL (ref 8.7–10.2)
Chloride: 102 mmol/L (ref 96–106)
Creatinine, Ser: 0.77 mg/dL (ref 0.57–1.00)
Globulin, Total: 2.3 g/dL (ref 1.5–4.5)
Glucose: 93 mg/dL (ref 70–99)
Potassium: 4.1 mmol/L (ref 3.5–5.2)
Sodium: 140 mmol/L (ref 134–144)
Total Protein: 6.6 g/dL (ref 6.0–8.5)
eGFR: 96 mL/min/{1.73_m2} (ref >59)

## 2024-04-21 LAB — LIPID PANEL
Chol/HDL Ratio: 5.4 ratio — ABNORMAL HIGH (ref 0.0–4.4)
Cholesterol, Total: 239 mg/dL — ABNORMAL HIGH (ref 100–199)
HDL: 44 mg/dL (ref >39)
LDL Chol Calc (NIH): 174 mg/dL — ABNORMAL HIGH (ref 0–99)
Triglycerides: 116 mg/dL (ref 0–149)
VLDL Cholesterol Cal: 21 mg/dL (ref 5–40)

## 2024-04-21 LAB — VITAMIN D 25 HYDROXY (VIT D DEFICIENCY, FRACTURES): Vit D, 25-Hydroxy: 10.1 ng/mL — ABNORMAL LOW (ref 30.0–100.0)

## 2024-04-22 ENCOUNTER — Ambulatory Visit: Payer: Self-pay | Admitting: Medical-Surgical

## 2024-04-22 DIAGNOSIS — E559 Vitamin D deficiency, unspecified: Secondary | ICD-10-CM

## 2024-04-22 MED ORDER — VITAMIN D (ERGOCALCIFEROL) 1.25 MG (50000 UNIT) PO CAPS: 50000.0000 [IU] | ORAL_CAPSULE | ORAL | 0 refills | Status: AC

## 2025-04-26 ENCOUNTER — Encounter: Admitting: Medical-Surgical
